# Patient Record
Sex: Male | Born: 1985 | Race: White | Hispanic: No | Marital: Single | Smoking: Former smoker
Health system: Southern US, Community
[De-identification: ages and names within clinical notes are randomized; demographics above are authoritative.]

## PROBLEM LIST (undated history)

## (undated) DIAGNOSIS — M549 Dorsalgia, unspecified: Secondary | ICD-10-CM

## (undated) DIAGNOSIS — M79671 Pain in right foot: Secondary | ICD-10-CM

## (undated) DIAGNOSIS — M25569 Pain in unspecified knee: Secondary | ICD-10-CM

## (undated) HISTORY — DX: Pain in right foot: M79.671

## (undated) HISTORY — DX: Pain in unspecified knee: M25.569

## (undated) HISTORY — DX: Morbid (severe) obesity due to excess calories: E66.01

## (undated) HISTORY — DX: Dorsalgia, unspecified: M54.9

---

## 2007-12-30 ENCOUNTER — Emergency Department (HOSPITAL_COMMUNITY): Admission: EM | Admit: 2007-12-30 | Discharge: 2007-12-30 | Payer: Self-pay | Admitting: Emergency Medicine
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
## 2018-06-08 LAB — HEPATIC FUNCTION PANEL
ALT: 19 (ref 10–40)
AST: 15 (ref 14–40)
Alkaline Phosphatase: 74 (ref 25–125)
Bilirubin, Total: 0.6

## 2018-06-08 LAB — COMPREHENSIVE METABOLIC PANEL
Albumin: 4.4 (ref 3.5–5.0)
Calcium: 9.5 (ref 8.7–10.7)
GFR calc Af Amer: 101.22
GFR calc non Af Amer: 83.65

## 2018-06-08 LAB — BASIC METABOLIC PANEL
BUN: 11 (ref 4–21)
Chloride: 106 (ref 99–108)
Creatinine: 1 (ref 0.6–1.3)
Glucose: 92
Potassium: 3.6 (ref 3.4–5.3)
Sodium: 143 (ref 137–147)

## 2018-06-08 LAB — CBC AND DIFFERENTIAL
HCT: 41 (ref 41–53)
Hemoglobin: 14 (ref 13.5–17.5)
Neutrophils Absolute: 6
Platelets: 222 (ref 150–399)
WBC: 9.3

## 2018-06-15 DIAGNOSIS — Z87898 Personal history of other specified conditions: Secondary | ICD-10-CM

## 2018-06-15 DIAGNOSIS — R002 Palpitations: Secondary | ICD-10-CM

## 2018-06-15 DIAGNOSIS — R0602 Shortness of breath: Secondary | ICD-10-CM

## 2018-06-15 DIAGNOSIS — R072 Precordial pain: Secondary | ICD-10-CM | POA: Insufficient documentation

## 2018-06-15 HISTORY — PX: ELECTROCARDIOGRAM: SHX264

## 2018-06-15 HISTORY — DX: Personal history of other specified conditions: Z87.898

## 2018-06-15 HISTORY — DX: Palpitations: R00.2

## 2018-06-15 HISTORY — DX: Shortness of breath: R06.02

## 2018-06-16 HISTORY — PX: OTHER SURGICAL HISTORY: SHX169

## 2018-06-26 HISTORY — PX: TRANSTHORACIC ECHOCARDIOGRAM: SHX275

## 2018-07-05 DIAGNOSIS — R140 Abdominal distension (gaseous): Secondary | ICD-10-CM | POA: Insufficient documentation

## 2018-07-05 DIAGNOSIS — R1010 Upper abdominal pain, unspecified: Secondary | ICD-10-CM

## 2018-07-05 DIAGNOSIS — K219 Gastro-esophageal reflux disease without esophagitis: Secondary | ICD-10-CM | POA: Insufficient documentation

## 2018-07-05 HISTORY — DX: Upper abdominal pain, unspecified: R10.10

## 2018-07-05 HISTORY — DX: Abdominal distension (gaseous): R14.0

## 2018-07-05 HISTORY — PX: ESOPHAGOGASTRODUODENOSCOPY: SHX1529

## 2019-05-16 ENCOUNTER — Telehealth: Payer: Self-pay

## 2019-05-16 NOTE — Telephone Encounter (Signed)
Received medical records from [facility].  I reviewed records and abstracted information into pts chart.   Records have been placed on Dr. McGowen's desk for review.   

## 2019-05-17 NOTE — Telephone Encounter (Signed)
Noted  

## 2019-05-24 ENCOUNTER — Ambulatory Visit: Payer: Self-pay | Admitting: Family Medicine

## 2019-05-24 ENCOUNTER — Ambulatory Visit (INDEPENDENT_AMBULATORY_CARE_PROVIDER_SITE_OTHER): Payer: 59 | Admitting: Family Medicine

## 2019-05-24 ENCOUNTER — Other Ambulatory Visit: Payer: Self-pay

## 2019-05-24 ENCOUNTER — Encounter: Payer: Self-pay | Admitting: Family Medicine

## 2019-05-24 VITALS — BP 129/84 | HR 96 | Temp 98.4°F | Resp 17 | Ht 71.0 in | Wt 284.0 lb

## 2019-05-24 DIAGNOSIS — L74519 Primary focal hyperhidrosis, unspecified: Secondary | ICD-10-CM

## 2019-05-24 DIAGNOSIS — R140 Abdominal distension (gaseous): Secondary | ICD-10-CM | POA: Diagnosis not present

## 2019-05-24 DIAGNOSIS — K929 Disease of digestive system, unspecified: Secondary | ICD-10-CM

## 2019-05-24 DIAGNOSIS — R195 Other fecal abnormalities: Secondary | ICD-10-CM

## 2019-05-24 DIAGNOSIS — R41840 Attention and concentration deficit: Secondary | ICD-10-CM | POA: Diagnosis not present

## 2019-05-24 DIAGNOSIS — G8929 Other chronic pain: Secondary | ICD-10-CM

## 2019-05-24 DIAGNOSIS — N4889 Other specified disorders of penis: Secondary | ICD-10-CM | POA: Diagnosis not present

## 2019-05-24 DIAGNOSIS — R1013 Epigastric pain: Secondary | ICD-10-CM

## 2019-05-24 LAB — COMPREHENSIVE METABOLIC PANEL
ALT: 45 U/L (ref 0–53)
AST: 26 U/L (ref 0–37)
Albumin: 4.5 g/dL (ref 3.5–5.2)
Alkaline Phosphatase: 66 U/L (ref 39–117)
BUN: 11 mg/dL (ref 6–23)
CO2: 29 mEq/L (ref 19–32)
Calcium: 9.5 mg/dL (ref 8.4–10.5)
Chloride: 103 mEq/L (ref 96–112)
Creatinine, Ser: 0.84 mg/dL (ref 0.40–1.50)
GFR: 105.21 mL/min (ref >60.00)
Glucose, Bld: 103 mg/dL — ABNORMAL HIGH (ref 70–99)
Potassium: 4.1 mEq/L (ref 3.5–5.1)
Sodium: 139 mEq/L (ref 135–145)
Total Bilirubin: 0.4 mg/dL (ref 0.2–1.2)
Total Protein: 6.8 g/dL (ref 6.0–8.3)

## 2019-05-24 LAB — CBC WITH DIFFERENTIAL/PLATELET
Basophils Absolute: 0 10*3/uL (ref 0.0–0.1)
Basophils Relative: 0.5 % (ref 0.0–3.0)
Eosinophils Absolute: 0.1 10*3/uL (ref 0.0–0.7)
Eosinophils Relative: 2.3 % (ref 0.0–5.0)
HCT: 41.4 % (ref 39.0–52.0)
Hemoglobin: 14.1 g/dL (ref 13.0–17.0)
Lymphocytes Relative: 26.9 % (ref 12.0–46.0)
Lymphs Abs: 1.3 10*3/uL (ref 0.7–4.0)
MCHC: 34.2 g/dL (ref 30.0–36.0)
MCV: 88.9 fl (ref 78.0–100.0)
Monocytes Absolute: 0.4 10*3/uL (ref 0.1–1.0)
Monocytes Relative: 8.1 % (ref 3.0–12.0)
Neutro Abs: 2.9 10*3/uL (ref 1.4–7.7)
Neutrophils Relative %: 62.2 % (ref 43.0–77.0)
Platelets: 185 10*3/uL (ref 150.0–400.0)
RBC: 4.66 Mil/uL (ref 4.22–5.81)
RDW: 13.2 % (ref 11.5–15.5)
WBC: 4.7 10*3/uL (ref 4.0–10.5)

## 2019-05-24 LAB — SEDIMENTATION RATE: Sed Rate: 5 mm/hr (ref 0–15)

## 2019-05-24 LAB — TSH: TSH: 1 u[IU]/mL (ref 0.35–4.50)

## 2019-05-24 LAB — IGA: IgA: 193 mg/dL (ref 68–378)

## 2019-05-24 LAB — C-REACTIVE PROTEIN: CRP: <1 mg/dL (ref 0.5–20.0)

## 2019-05-24 NOTE — Progress Notes (Signed)
Office Note 05/28/2019  CC:  Chief Complaint  Patient presents with  . New Patient (Initial Visit)  . Abdominal Issues   HPI:  Eric Cain is a 33 y.o.  male who is here today to establish care and discuss abdominal complaints. Patient's most recent primary MD: NONE. Old records from [facility] in [place] and from [place] were reviewed prior to or during today's visit.  Hx (chronic, or at least the last few years) of upper abd bloating and discomfort intermittently, some postprandial urgent BMs but not much.  No signif GERD sx's but sounds like some epigastric burning sometimes.  Even some lower chest discomfort, palpitations, and SOB with the abd complaints but not frequently and in fact hard to tell if even occurring WITH the upper abd c/o or SEPARATE from them.  Not assoc with particular foods/food types/or overeating but does say that eating in general does make his sx's more prominent.  No n/v.  Occ constipation or "feeling like I have to have a BM a lot but nothing comes out".   No blood in stool, no frequent diarrhea stools.  Has some mucous noted in stool "about 1 in 10 stools". He is sometimes hard to get definite answers out of-->his response to many specific questions is "sometimes" or "kind of". He also starts new complaints while we're discussing another complaint, he definitely acts very worried about his sx's and despite a reassuring GI eval 06/08/18 he is convinced something is being missed (GI note reviewed today, exam documented as normal, CMET,lipase, and CBC normal.  CT abd recommended as was EGD.  CT abd 06/16/18 showed some gastric wall thickening, f/u EGD 07/05/18 normal. CT abnormality presumed to be from incomplete distention of stomach).  GI also referred pt to cardiology b/c of pt's c/o palpitations, SOB, lower chest discomfort.  [facility] saw him 06/15/18->EKG normal.  ECHO recommended->06/26/18->entirely normal.  No fevers.  Appetite is good.   No dysphagia or odynophagia.  No frequent NSAIDs.  Occ took rolaids/tums for sx's and these helped "some" but hard to tell how much or why he did not continue to try these meds.  Says GI put him on PPI and he took it for 3-4 wks but found it to be of no help so he stopped it.  FODMAP diet advised by GI 05/2018 but hard to tell if he followed this.  Per GI note from 06/08/18 he was drinking 6-10 beers a couple nights per week and he was advised to stop drinking.  Also of note, he is very perplexed by sweating under abd pannus area as well as across his low back. Also says he has "genital warts" and asks to be tested for "the cancer from HPV". He admits he has been doing A LOT of research on google AND getting advice from a family member who is a Designer, jewellery.  Denies being sexually active, denies having intercourse with anyone with known genital wards or other STD.  NO penile d/c. He says the top of penis hurts where he feels and sees a "wart".  Exercise: most days he does a bit of cardio and lifts some wts.  States last Tdap was "2019".  Past Medical History:  Diagnosis Date  . Abdominal bloating 2019/2020   Eval led to explanation as "possible reflux and/or dyspepsia".  . History of precordial chest pain 06/15/2018  . Palpitations 06/15/2018  . SOB (shortness of breath) 06/15/2018  . Upper abdominal pain 07/05/2018  Past Surgical History:  Procedure Laterality Date  . Cat Scan Abdmoen with IV Contrast and PO Contrast  06/16/2018   Apparent gastric wall thickening towards fundus may be due to incomplete distention versus true wall thickening  . ELECTROCARDIOGRAM  06/15/2018   normal sinus rhythm, normal ECG  . ESOPHAGOGASTRODUODENOSCOPY  07/05/2018   ->normal->"this may be functional dyspepsia or reflux"  . SIGMOIDOSCOPY  approx 2013   [facility]->for feeling of constant rectal pressure; normal  . TRANSTHORACIC ECHOCARDIOGRAM  06/26/2018   NORMAL    History reviewed. No  pertinent family history.  Social History   Socioeconomic History  . Marital status: Single    Spouse name: Not on file  . Number of children: Not on file  . Years of education: Not on file  . Highest education level: Associate degree: occupational, Hotel manager, or vocational program  Occupational History  . Not on file  Tobacco Use  . Smoking status: Former Smoker    Packs/day: 1.00    Years: 7.00    Pack years: 7.00    Types: Cigarettes  . Smokeless tobacco: Former Network engineer and Sexual Activity  . Alcohol use: Yes    Comment: 6-12 drinks in a week  . Drug use: Never  . Sexual activity: Not Currently  Other Topics Concern  . Not on file  Social History Narrative   Single, no childen.   Educ: Associates deg in Hotel manager.   Occup: lost job due to covid.  Living in [place], relocated from [place].   Tob: no signif   Alc: wine 1 bottle per week.  Sometimes beer--6-12 per week.   At one point was drinking a 2-3 beers per day.   Drugs: none   Social Determinants of Health   Financial Resource Strain:   . Difficulty of Paying Living Expenses: Not on file  Food Insecurity:   . Worried About Charity fundraiser in the Last Year: Not on file  . Ran Out of Food in the Last Year: Not on file  Transportation Needs:   . Lack of Transportation (Medical): Not on file  . Lack of Transportation (Non-Medical): Not on file  Physical Activity:   . Days of Exercise per Week: Not on file  . Minutes of Exercise per Session: Not on file  Stress:   . Feeling of Stress : Not on file  Social Connections:   . Frequency of Communication with Friends and Family: Not on file  . Frequency of Social Gatherings with Friends and Family: Not on file  . Attends Religious Services: Not on file  . Active Member of Clubs or Organizations: Not on file  . Attends Archivist Meetings: Not on file  . Marital Status: Not on file  Intimate Partner Violence:   .  Fear of Current or Ex-Partner: Not on file  . Emotionally Abused: Not on file  . Physically Abused: Not on file  . Sexually Abused: Not on file   MEDS: currently NONE  Not on File  Review of Systems  Constitutional: Positive for unexpected weight change ("trouble losing wt"). Negative for fatigue and fever.  HENT: Negative for congestion and sore throat.   Eyes: Negative for visual disturbance.  Respiratory: Negative for cough.   Cardiovascular: Negative for chest pain.  Gastrointestinal: Negative for abdominal pain and nausea.  Endocrine: Positive for heat intolerance ("I sweat alot->exclusively says under pannus and accros low back). Negative for cold intolerance, polydipsia, polyphagia and polyuria.  Genitourinary: Positive for penile pain. Negative for dysuria.  Musculoskeletal: Negative for back pain and joint swelling.  Skin: Negative for pallor and rash.  Neurological: Positive for dizziness. Negative for tremors, seizures, syncope, weakness and headaches.  Hematological: Negative for adenopathy.  Psychiatric/Behavioral: Positive for decreased concentration. Negative for dysphoric mood. The patient is nervous/anxious.        Poor focus   See HPI, plus:   PE; Blood pressure 129/84, pulse 96, temperature 98.4 F (36.9 C), temperature source Temporal, resp. rate 17, height 5' 11"  (1.803 m), weight 284 lb (128.8 kg), SpO2 96 %. Body mass index is 39.61 kg/m.   Gen: Alert, well appearing.  Patient is oriented to person, place, time, and situation. AFFECT: pleasant, lucid thought and speech. IDP:OEUM: no injection, icteris, swelling, or exudate.  EOMI, PERRLA. Mouth: lips without lesion/swelling.  Oral mucosa pink and moist. Oropharynx without erythema, exudate, or swelling.  Neck - No masses or thyromegaly or limitation in range of motion CV: RRR, no m/r/g.   LUNGS: CTA bilat, nonlabored resps, good aeration in all lung fields. ABD: soft, NT, ND, BS normal.  No  hepatospenomegaly or mass.  No bruits. EXT: no clubbing or cyanosis.  no edema.  Skin - no sores or suspicious lesions or rashes or color changes Neuro: CN 2-12 intact bilaterally, strength 5/5 in proximal and distal upper extremities and lower extremities bilaterally.    No tremor.    No ataxia.  Genitals normal; both testes normal without tenderness, masses, hydroceles, varicoceles, erythema or swelling. Shaft normal, circumcised, meatus without discharge, erythema, or deformity.  When he manually opens his meatus wide, I can see a small nodular disruption in the normal mucosal lining but this does NOT appear verrucous. No inguinal hernia noted. No inguinal lymphadenopathy. I DO NOT see any focal lesions of GU area.  Specifically, no warts, vesicles, ulcerations.      Pertinent labs:  Lab Results  Component Value Date   TSH 1.00 05/24/2019   Lab Results  Component Value Date   WBC 4.7 05/24/2019   HGB 14.1 05/24/2019   HCT 41.4 05/24/2019   MCV 88.9 05/24/2019   PLT 185.0 05/24/2019   Lab Results  Component Value Date   CREATININE 0.84 05/24/2019   BUN 11 05/24/2019   NA 139 05/24/2019   K 4.1 05/24/2019   CL 103 05/24/2019   CO2 29 05/24/2019   Lab Results  Component Value Date   ALT 45 05/24/2019   AST 26 05/24/2019   ALKPHOS 66 05/24/2019   BILITOT 0.4 05/24/2019   No results found for: CHOL No results found for: HDL No results found for: LDLCALC No results found for: TRIG No results found for: CHOLHDL No results found for: PSA  No results found for: HGBA1C   ASSESSMENT AND PLAN:   New pt: no primary care MD, but GI eval and cardiology eval in [place] 2020--all records reviewed today.  1) GI complaints: dyspepsia likely, also could be IBS--primarily constipation predominant. Celiac dz and IBD in differential.  Pt with excessive amount of concern about his sx's, almost to the point of hypochondriasis. Will ask GI to see him. Will check celiac labs, CMET,  CBC, CRP, ESR.  He also requested stool eval, which I ordered today although suspect will be low yield. No meds rx'd today but will likely get him back on PPI qd trial and refer to nutritionist for help with FODMAP diet in future IF GI agrees this may be a functional  GI disorder. He may just need LOTS of reassurance.  2) Genital complaints: c/o "warts" but I see none.  He has a slight meatal nodularity that appears submucosal and he says this causes pain so I'll refer him to urology to see what they think. I repeatedly reassured him that I see no evidence of genital warts or any other kind of skin abnormality.  3) Sweating: he c/o sweating in areas where this can be expected, esp in an obese individual (underneath pannus and in low back area).  No rash is present.  Reassurance given, although he did not appear reassured by this. With this c/o + difficulty losing wt (which I suspect is more due to calories in >calories out), will check TSH.  4) Focus/Attention concerns: at the very end of today's visit, pt wanted to discuss these complaints. He asked who would be the best provider to assess him for ADD and I told him [facility] in [place]->I ordered this referral today.  An After Visit Summary was printed and given to the patient.  Spent 50 min with pt today, with >50% of this time spent in counseling and care coordination regarding the above problems.  Return in about 2 weeks (around 06/07/2019) for f/u abd complaints .  Signed:  Crissie Sickles, MD           05/28/2019

## 2019-05-28 ENCOUNTER — Encounter: Payer: Self-pay | Admitting: Family Medicine

## 2019-05-28 LAB — TISSUE TRANSGLUTAMINASE, IGA: (tTG) Ab, IgA: 1 U/mL

## 2019-06-01 LAB — GIARDIA/CRYPTOSPORIDIUM (EIA)
MICRO NUMBER:: 10004037
MICRO NUMBER:: 10004038
RESULT:: NOT DETECTED
RESULT:: NOT DETECTED
SPECIMEN QUALITY:: ADEQUATE
SPECIMEN QUALITY:: ADEQUATE

## 2019-06-01 LAB — STOOL CULTURE
MICRO NUMBER:: 10005529
MICRO NUMBER:: 10005530
MICRO NUMBER:: 10005531
SHIGA RESULT:: NOT DETECTED
SPECIMEN QUALITY:: ADEQUATE
SPECIMEN QUALITY:: ADEQUATE
SPECIMEN QUALITY:: ADEQUATE

## 2019-06-01 LAB — OVA AND PARASITE EXAMINATION
CONCENTRATE RESULT:: NONE SEEN
MICRO NUMBER:: 10004039
SPECIMEN QUALITY:: ADEQUATE
TRICHROME RESULT:: NONE SEEN

## 2019-06-01 LAB — CLOSTRIDIUM DIFFICILE TOXIN B, QUALITATIVE, REAL-TIME PCR: Toxigenic C. Difficile by PCR: NOT DETECTED

## 2019-06-11 ENCOUNTER — Other Ambulatory Visit: Payer: Self-pay

## 2019-06-11 ENCOUNTER — Ambulatory Visit (INDEPENDENT_AMBULATORY_CARE_PROVIDER_SITE_OTHER): Payer: 59 | Admitting: Family Medicine

## 2019-06-11 ENCOUNTER — Encounter: Payer: Self-pay | Admitting: Family Medicine

## 2019-06-11 VITALS — BP 138/88 | HR 91 | Temp 98.4°F | Resp 16 | Ht 71.0 in | Wt 286.6 lb

## 2019-06-11 DIAGNOSIS — R1013 Epigastric pain: Secondary | ICD-10-CM | POA: Diagnosis not present

## 2019-06-11 DIAGNOSIS — E669 Obesity, unspecified: Secondary | ICD-10-CM

## 2019-06-11 MED ORDER — PANTOPRAZOLE SODIUM 40 MG PO TBEC: 40.0000 mg | DELAYED_RELEASE_TABLET | Freq: Every day | ORAL | 3 refills | Status: DC

## 2019-06-11 NOTE — Progress Notes (Signed)
OFFICE VISIT  06/11/2019   CC:  Chief Complaint  Patient presents with  . Follow-up    abdomnial issues, pt is not fasting   HPI:    Patient is a 34 y.o. Caucasian male who presents for 3 week f/u several issues. A/P as of last visit (his "establish care" visit): " 1) GI complaints: dyspepsia likely, also could be IBS--primarily constipation predominant. Celiac dz and IBD in differential.  Pt with excessive amount of concern about his sx's, almost to the point of hypochondriasis. Will ask GI to see him. Will check celiac labs, CMET, CBC, CRP, ESR.  He also requested stool eval, which I ordered today although suspect will be low yield. No meds rx'd today but will likely get him back on PPI qd trial and refer to nutritionist for help with FODMAP diet in future IF GI agrees this may be a functional GI disorder. He may just need LOTS of reassurance.  2) Genital complaints: c/o "warts" but I see none.  He has a slight meatal nodularity that appears submucosal and he says this causes pain so I'll refer him to urology to see what they think. I repeatedly reassured him that I see no evidence of genital warts or any other kind of skin abnormality.  3) Sweating: he c/o sweating in areas where this can be expected, esp in an obese individual (underneath pannus and in low back area).  No rash is present.  Reassurance given, although he did not appear reassured by this. With this c/o + difficulty losing wt (which I suspect is more due to calories in >calories out), will check TSH.  4) Focus/Attention concerns: at the very end of today's visit, pt wanted to discuss these complaints. He asked who would be the best provider to assess him for ADD and I told him [facility] in [place]->I ordered this referral today."  Interim hx:  All blood and stool tests done last visit were NORMAL. I inadvertently failed to refer him to GI after last visit. Denies any change: still mainly bloated  feeling not tied to any specific types of food. Has been doing more juicing in diet. Treadmill 15 min, wts 45 min.  He's frustrated with years of wt gain despite feeling like he eats reasonably and exercises regularly. Got appt arranged with urology.  Past Medical History:  Diagnosis Date  . Abdominal bloating 2019/2020   Eval led to explanation as "possible reflux and/or dyspepsia".  . History of precordial chest pain 06/15/2018  . Palpitations 06/15/2018  . SOB (shortness of breath) 06/15/2018  . Upper abdominal pain 07/05/2018    Past Surgical History:  Procedure Laterality Date  . Cat Scan Abdmoen with IV Contrast and PO Contrast  06/16/2018   Apparent gastric wall thickening towards fundus may be due to incomplete distention versus true wall thickening  . ELECTROCARDIOGRAM  06/15/2018   normal sinus rhythm, normal ECG  . ESOPHAGOGASTRODUODENOSCOPY  07/05/2018   ->normal->"this may be functional dyspepsia or reflux"  . SIGMOIDOSCOPY  approx 2013   [facility]->for feeling of constant rectal pressure; normal  . TRANSTHORACIC ECHOCARDIOGRAM  06/26/2018   NORMAL     No Known Allergies  ROS As per HPI  PE: Blood pressure 138/88, pulse 91, temperature 98.4 F (36.9 C), temperature source Temporal, resp. rate 16, height 5' 11" (1.803 m), weight 286 lb 9.6 oz (130 kg), SpO2 97 %. Body mass index is 39.97 kg/m.  Gen: Alert, well appearing.  Patient is oriented to  person, place, time, and situation. AFFECT: pleasant, lucid thought and speech. No exam today.  LABS:  Lab Results  Component Value Date   TSH 1.00 05/24/2019   Lab Results  Component Value Date   WBC 4.7 05/24/2019   HGB 14.1 05/24/2019   HCT 41.4 05/24/2019   MCV 88.9 05/24/2019   PLT 185.0 05/24/2019   Lab Results  Component Value Date   CREATININE 0.84 05/24/2019   BUN 11 05/24/2019   NA 139 05/24/2019   K 4.1 05/24/2019   CL 103 05/24/2019   CO2 29 05/24/2019   Lab Results  Component  Value Date   ALT 45 05/24/2019   AST 26 05/24/2019   ALKPHOS 66 05/24/2019   BILITOT 0.4 05/24/2019   Lab Results  Component Value Date   ESRSEDRATE 5 05/24/2019   Lab Results  Component Value Date   CRP <1.0 05/24/2019   No results found for: HGBA1C  IMPRESSION AND PLAN:  1) GI complaints--many->most c/w dyspepsia. Start pantoprazole 69m qd.  2) Obesity: suspect insulin resistance.  However, diet/caloric intake still likely >calories burned. Pt agreeable to [facility]-->Dr. BShary Decamp  3) Penis c/o->small nodular spot at tip of meatus->he has urologic appt for further eval.  4) Poos concentration/focus: [place]  An After Visit Summary was printed and given to the patient.  FOLLOW UP: Return in about 4 weeks (around 07/09/2019) for f/u abd probs/dyspepsia.  Signed:  PCrissie Sickles MD           06/11/2019

## 2019-07-09 ENCOUNTER — Encounter: Payer: Self-pay | Admitting: Family Medicine

## 2019-07-09 ENCOUNTER — Ambulatory Visit (INDEPENDENT_AMBULATORY_CARE_PROVIDER_SITE_OTHER): Payer: 59 | Admitting: Family Medicine

## 2019-07-09 ENCOUNTER — Other Ambulatory Visit: Payer: Self-pay

## 2019-07-09 DIAGNOSIS — K219 Gastro-esophageal reflux disease without esophagitis: Secondary | ICD-10-CM | POA: Diagnosis not present

## 2019-07-09 DIAGNOSIS — R6882 Decreased libido: Secondary | ICD-10-CM | POA: Diagnosis not present

## 2019-07-09 LAB — HEMOGLOBIN A1C: Hgb A1c MFr Bld: 5.1 % (ref 4.6–6.5)

## 2019-07-09 MED ORDER — PANTOPRAZOLE SODIUM 40 MG PO TBEC: DELAYED_RELEASE_TABLET | ORAL | 3 refills | Status: AC

## 2019-07-09 NOTE — Progress Notes (Signed)
OFFICE VISIT  07/09/2019   CC:  Chief Complaint  Patient presents with  . Follow-up    abdominal pain   HPI:    Patient is a 34 y.o. Caucasian male who presents for 1 mo f/u GI complaints. A/P as of last visit: "1) GI complaints--many->most c/w dyspepsia. Start pantoprazole 40mg  qd.  2) Obesity: suspect insulin resistance.  However, diet/caloric intake still likely >calories burned. Pt agreeable to referral [facility]-->Dr. Shary Decamp.  3) Penis c/o->small nodular spot at tip of meatus->he has urologic appt for further eval.  4) Poos concentration/focus: I've referred him to [employer] but no appt arranged yet."  Interim hx: No improvement on med.  Having notable GER sx's about 5 min after eating. A few bad episodes of GER last week, regurg up to mouth/nose.  Has eliminated beer and coffee. Starting keto diet today.  3-4 d/week workouts some cardio/some wt's.  [facility]ariat[facility]/wt loss clinic contacted him but he has not gotten back with them. Still not working but is closing on his home sale in [place] soon, then will start looking seriously.  Libido: impaired somewhat x years.  Reports hx of borderline low testost about 10 yrs ago.    Past Medical History:  Diagnosis Date  . Abdominal bloating 2019/2020   Eval led to explanation as "possible reflux and/or dyspepsia".  . History of precordial chest pain 06/15/2018  . Morbid obesity ([place])   . Palpitations 06/15/2018  . SOB (shortness of breath) 06/15/2018  . Upper abdominal pain 07/05/2018    Past Surgical History:  Procedure Laterality Date  . CT abd/pelv w/cont  06/16/2018   Apparent gastric wall thickening towards fundus may be due to incomplete distention versus true wall thickening  . ELECTROCARDIOGRAM  06/15/2018   normal sinus rhythm, normal ECG  . ESOPHAGOGASTRODUODENOSCOPY  07/05/2018   ->normal->"this may be functional dyspepsia or reflux"  . SIGMOIDOSCOPY  approx  2013   [facility]->for feeling of constant rectal pressure; normal  . TRANSTHORACIC ECHOCARDIOGRAM  06/26/2018   NORMAL    Outpatient Medications Prior to Visit  Medication Sig Dispense Refill  . pantoprazole (PROTONIX) 40 MG tablet Take 1 tablet (40 mg total) by mouth daily. 30 tablet 3   No facility-administered medications prior to visit.    No Known Allergies  ROS As per HPI  PE: Blood pressure 132/84, pulse 84, temperature 98 F (36.7 C), temperature source Temporal, resp. rate 16, height 5\' 11"  (1.803 m), weight 290 lb 6.4 oz (131.7 kg), SpO2 98 %. Body mass index is 40.5 kg/m.  Gen: Alert, well appearing.  Patient is oriented to person, place, time, and situation. AFFECT: pleasant, lucid thought and speech. NO further exam today.  LABS:  Lab Results  Component Value Date   TSH 1.00 05/24/2019   Lab Results  Component Value Date   WBC 4.7 05/24/2019   HGB 14.1 05/24/2019   HCT 41.4 05/24/2019   MCV 88.9 05/24/2019   PLT 185.0 05/24/2019   Lab Results  Component Value Date   CREATININE 0.84 05/24/2019   BUN 11 05/24/2019   NA 139 05/24/2019   K 4.1 05/24/2019   CL 103 05/24/2019   CO2 29 05/24/2019   Lab Results  Component Value Date   ALT 45 05/24/2019   AST 26 05/24/2019   ALKPHOS 66 05/24/2019   BILITOT 0.4 05/24/2019   Lab Results  Component Value Date   ESRSEDRATE 5 05/24/2019    IMPRESSION AND PLAN:  1) GERD, dyspepsia: no change on pantoprazole 40mg  qd x 1 mo. Go to bid dosing of pantoprazole.   Elevate HOB. He is making some dietary changes.  He's hoping keto diet helps,starting this today.  2) Morbid obesity. He has pretty good dietary habits, good exercise habits. Not much success losing wt.  He is contemplating going to wt loss clinic. Check HbA1c today.  3) Decreased libido: check testost level today (10AM).  An After Visit Summary was printed and given to the patient.  FOLLOW UP: Return in about 4 weeks (around  08/06/2019) for f/u GER/dyspepsia.  Signed:  08/08/2019, MD           07/09/2019

## 2019-07-09 NOTE — Patient Instructions (Signed)
Gastroesophageal Reflux Disease, Adult Gastroesophageal reflux (GER) happens when acid from the stomach flows up into the tube that connects the mouth and the stomach (esophagus). Normally, food travels down the esophagus and stays in the stomach to be digested. However, when a person has GER, food and stomach acid sometimes move back up into the esophagus. If this becomes a more serious problem, the person may be diagnosed with a disease called gastroesophageal reflux disease (GERD). GERD occurs when the reflux:  Happens often.  Causes frequent or severe symptoms.  Causes problems such as damage to the esophagus. When stomach acid comes in contact with the esophagus, the acid may cause soreness (inflammation) in the esophagus. Over time, GERD may create small holes (ulcers) in the lining of the esophagus. What are the causes? This condition is caused by a problem with the muscle between the esophagus and the stomach (lower esophageal sphincter, or LES). Normally, the LES muscle closes after food passes through the esophagus to the stomach. When the LES is weakened or abnormal, it does not close properly, and that allows food and stomach acid to go back up into the esophagus. The LES can be weakened by certain dietary substances, medicines, and medical conditions, including:  Tobacco use.  Pregnancy.  Having a hiatal hernia.  Alcohol use.  Certain foods and beverages, such as coffee, chocolate, onions, and peppermint. What increases the risk? You are more likely to develop this condition if you:  Have an increased body weight.  Have a connective tissue disorder.  Use NSAID medicines. What are the signs or symptoms? Symptoms of this condition include:  Heartburn.  Difficult or painful swallowing.  The feeling of having a lump in the throat.  Abitter taste in the mouth.  Bad breath.  Having a large amount of saliva.  Having an upset or bloated  stomach.  Belching.  Chest pain. Different conditions can cause chest pain. Make sure you see your health care provider if you experience chest pain.  Shortness of breath or wheezing.  Ongoing (chronic) cough or a night-time cough.  Wearing away of tooth enamel.  Weight loss. How is this diagnosed? Your health care provider will take a medical history and perform a physical exam. To determine if you have mild or severe GERD, your health care provider may also monitor how you respond to treatment. You may also have tests, including:  A test to examine your stomach and esophagus with a small camera (endoscopy).  A test thatmeasures the acidity level in your esophagus.  A test thatmeasures how much pressure is on your esophagus.  A barium swallow or modified barium swallow test to show the shape, size, and functioning of your esophagus. How is this treated? The goal of treatment is to help relieve your symptoms and to prevent complications. Treatment for this condition may vary depending on how severe your symptoms are. Your health care provider may recommend:  Changes to your diet.  Medicine.  Surgery. Follow these instructions at home: Eating and drinking   Follow a diet as recommended by your health care provider. This may involve avoiding foods and drinks such as: ? Coffee and tea (with or without caffeine). ? Drinks that containalcohol. ? Energy drinks and sports drinks. ? Carbonated drinks or sodas. ? Chocolate and cocoa. ? Peppermint and mint flavorings. ? Garlic and onions. ? Horseradish. ? Spicy and acidic foods, including peppers, chili powder, curry powder, vinegar, hot sauces, and barbecue sauce. ? Citrus fruit juices and citrus   fruits, such as oranges, lemons, and limes. ? Tomato-based foods, such as red sauce, chili, salsa, and pizza with red sauce. ? Fried and fatty foods, such as donuts, french fries, potato chips, and high-fat dressings. ? High-fat  meats, such as hot dogs and fatty cuts of red and white meats, such as rib eye steak, sausage, ham, and bacon. ? High-fat dairy items, such as whole milk, butter, and cream cheese.  Eat small, frequent meals instead of large meals.  Avoid drinking large amounts of liquid with your meals.  Avoid eating meals during the 2-3 hours before bedtime.  Avoid lying down right after you eat.  Do not exercise right after you eat. Lifestyle   Do not use any products that contain nicotine or tobacco, such as cigarettes, e-cigarettes, and chewing tobacco. If you need help quitting, ask your health care provider.  Try to reduce your stress by using methods such as yoga or meditation. If you need help reducing stress, ask your health care provider.  If you are overweight, reduce your weight to an amount that is healthy for you. Ask your health care provider for guidance about a safe weight loss goal. General instructions  Pay attention to any changes in your symptoms.  Take over-the-counter and prescription medicines only as told by your health care provider. Do not take aspirin, ibuprofen, or other NSAIDs unless your health care provider told you to do so.  Wear loose-fitting clothing. Do not wear anything tight around your waist that causes pressure on your abdomen.  Raise (elevate) the head of your bed about 6 inches (15 cm).  Avoid bending over if this makes your symptoms worse.  Keep all follow-up visits as told by your health care provider. This is important. Contact a health care provider if:  You have: ? New symptoms. ? Unexplained weight loss. ? Difficulty swallowing or it hurts to swallow. ? Wheezing or a persistent cough. ? A hoarse voice.  Your symptoms do not improve with treatment. Get help right away if you:  Have pain in your arms, neck, jaw, teeth, or back.  Feel sweaty, dizzy, or light-headed.  Have chest pain or shortness of breath.  Vomit and your vomit looks  like blood or coffee grounds.  Faint.  Have stool that is bloody or black.  Cannot swallow, drink, or eat. Summary  Gastroesophageal reflux happens when acid from the stomach flows up into the esophagus. GERD is a disease in which the reflux happens often, causes frequent or severe symptoms, or causes problems such as damage to the esophagus.  Treatment for this condition may vary depending on how severe your symptoms are. Your health care provider may recommend diet and lifestyle changes, medicine, or surgery.  Contact a health care provider if you have new or worsening symptoms.  Take over-the-counter and prescription medicines only as told by your health care provider. Do not take aspirin, ibuprofen, or other NSAIDs unless your health care provider told you to do so.  Keep all follow-up visits as told by your health care provider. This is important. This information is not intended to replace advice given to you by your health care provider. Make sure you discuss any questions you have with your health care provider. Document Revised: 11/16/2017 Document Reviewed: 11/16/2017 Elsevier Patient Education  2020 [employer]  

## 2019-07-10 LAB — TESTOSTERONE TOTAL,FREE,BIO, MALES
Albumin: 4.4 g/dL (ref 3.6–5.1)
Sex Hormone Binding: 22 nmol/L (ref 10–50)
Testosterone, Bioavailable: 166.8 ng/dL (ref >=110.0)
Testosterone, Free: 82.9 pg/mL (ref 46.0–224.0)
Testosterone: 450 ng/dL (ref 250–827)

## 2019-08-20 ENCOUNTER — Encounter (INDEPENDENT_AMBULATORY_CARE_PROVIDER_SITE_OTHER): Payer: Self-pay | Admitting: Family Medicine

## 2019-08-20 ENCOUNTER — Other Ambulatory Visit: Payer: Self-pay

## 2019-08-20 ENCOUNTER — Ambulatory Visit (INDEPENDENT_AMBULATORY_CARE_PROVIDER_SITE_OTHER): Payer: 59 | Admitting: Family Medicine

## 2019-08-20 VITALS — BP 136/79 | HR 89 | Temp 98.4°F | Ht 70.0 in | Wt 282.0 lb

## 2019-08-20 DIAGNOSIS — Z9189 Other specified personal risk factors, not elsewhere classified: Secondary | ICD-10-CM | POA: Diagnosis not present

## 2019-08-20 DIAGNOSIS — R0602 Shortness of breath: Secondary | ICD-10-CM

## 2019-08-20 DIAGNOSIS — Z6841 Body Mass Index (BMI) 40.0 and over, adult: Secondary | ICD-10-CM

## 2019-08-20 DIAGNOSIS — R739 Hyperglycemia, unspecified: Secondary | ICD-10-CM

## 2019-08-20 DIAGNOSIS — R5383 Other fatigue: Secondary | ICD-10-CM | POA: Diagnosis not present

## 2019-08-20 DIAGNOSIS — Z0289 Encounter for other administrative examinations: Secondary | ICD-10-CM

## 2019-08-20 DIAGNOSIS — Z1331 Encounter for screening for depression: Secondary | ICD-10-CM

## 2019-08-20 NOTE — Progress Notes (Signed)
Dear Dr. Nicoletta Ba,   Thank you for referring Eric Cain to our clinic. The following note includes my evaluation and treatment recommendations.  Chief Complaint:   OBESITY Eric Cain (MR# [id number]) is a 34 y.o. male who presents for evaluation and treatment of obesity and related comorbidities. Current BMI is Body mass index is 40.46 kg/m.Eric Cain has been struggling with his weight for many years and has been unsuccessful in either losing weight, maintaining weight loss, or reaching his healthy weight goal.  Eric Cain is currently in the action stage of change and ready to dedicate time achieving and maintaining a healthier weight. Eric Cain is interested in becoming our patient and working on intensive lifestyle modifications including (but not limited to) diet and exercise for weight loss.  Eric Cain is referred by Dr. Milinda Cave. Pre-workout in the a.m. Garrell has a low fat, low carb protein shake and 3-4 eggs (feels full); 4-6 p.m. will have 2 chicken drumsticks and vegetables or 1/4 to 1/3 lb meat hamburger, 1 cup vegetables and baked potato. He exercises 4 times per week with cardio and weightlifting.  Eric Cain's habits were reviewed today and are as follows: His family eats meals together, his desired weight loss is 47 lbs, he has been heavy most of his life, he started gaining weight in 2015, his heaviest weight ever was 295 pounds, he skips breakfast sometimes, he is frequently drinking liquids with calories, he sometimes has problems with excessive hunger, he sometimes eats larger portions than normal, he has binge eating behaviors and he struggles with emotional eating.  Depression Screen Eric Cain's Food and Mood (modified PHQ-9) Cain was 12.  Depression screen PHQ 2/9 08/20/2019  Decreased Interest 2  Down, Depressed, Hopeless 2  PHQ - 2 Cain 4  Altered sleeping 1  Tired, decreased energy 3  Change in appetite 2  Feeling bad or failure about yourself  1  Trouble concentrating 0    Moving slowly or fidgety/restless 1  Suicidal thoughts 0  PHQ-9 Cain 12  Difficult doing work/chores Somewhat difficult   Subjective:   Other fatigue. Eric Cain denies daytime somnolence and admits to waking up still tired. Eric Cain generally gets 8 hours of sleep per night, and states that he has generally restful sleep 50% of the time. Eric Cain is present. Apneic episodes are not present. Eric Cain is 5.  Shortness of breath on exertion. [name] notes increasing shortness of breath with exercising and seems to be worsening over time with weight gain. He notes getting out of breath sooner with activity than he used to. This has gotten worse recently. Eric Cain denies shortness of breath at rest or orthopnea.  Hyperglycemia. Eric Cain has a history of some elevated blood glucose readings without a diagnosis of diabetes. Glucose 103. Last HgA1c 5.1.  Depression screening. Eric Cain had a moderately positive depression screen with a PHQ-9 Cain of 12.  At risk for diabetes mellitus. Eric Cain is at higher than average risk for developing diabetes due to his obesity.   Assessment/Plan:   Other fatigue. [name] does feel that his weight is causing his energy to be lower than it should be. Fatigue may be related to obesity, depression or many other causes. Labs will be ordered, and in the meanwhile, Eric Cain will focus on self care including making healthy food choices, increasing physical activity and focusing on stress reduction. EKG 12-Lead showed NSR @ 90 BPM. CBC with Differential/Platelet, T3, T4, free, TSH, VITAMIN D 25 Hydroxy (Vit-D Deficiency, Fractures), Folate, Vitamin B12 labs ordered  today.  Shortness of breath on exertion. [name] does feel that he gets out of breath more easily that he used to when he exercises. [name]'s shortness of breath appears to be obesity related and exercise induced. He has agreed to work on weight loss and gradually increase exercise to treat his exercise induced shortness  of breath. Will continue to monitor closely. Lipid Panel With LDL/HDL Ratio ordered.  Hyperglycemia. Fasting labs will be obtained and results with be discussed with Eric Cain in 2 weeks at his follow up visit. In the meanwhile Eric Cain was started on a lower simple carbohydrate diet and will work on weight loss efforts. Comprehensive metabolic panel, Insulin, random ordered today.  Depression screening. Eric Cain had a positive depression screening. Depression is commonly associated with obesity and often results in emotional eating behaviors. We will monitor this closely and work on CBT to help improve the non-hunger eating patterns. Referral to Psychology may be required if no improvement is seen as he continues in our clinic.  At risk for diabetes mellitus. Eric Cain was given approximately 15 minutes of diabetes education and counseling today. We discussed intensive lifestyle modifications today with an emphasis on weight loss as well as increasing exercise and decreasing simple carbohydrates in his diet. We also reviewed medication options with an emphasis on risk versus benefit of those discussed.   Repetitive spaced learning was employed today to elicit superior memory formation and behavioral change.  Class 3 severe obesity with serious comorbidity and body mass index (BMI) of 40.0 to 44.9 in adult, unspecified obesity type ([place]).  Eric Cain is currently in the action stage of change and his goal is to continue with weight loss efforts. I recommend Eric Cain begin the structured treatment plan as follows:  He has agreed to the Category 3 Plan.  Exercise goals: Eric Cain will continue his current exercise regimen.   Behavioral modification strategies: increasing lean protein intake, increasing vegetables, meal planning and cooking strategies, keeping healthy foods in the home and planning for success.  He was informed of the importance of frequent follow-up visits to maximize his success with intensive lifestyle  modifications for his multiple health conditions. He was informed we would discuss his lab results at his next visit unless there is a critical issue that needs to be addressed sooner. Duvan agreed to keep his next visit at the agreed upon time to discuss these results.  Objective:   Blood pressure 136/79, pulse 89, temperature 98.4 F (36.9 C), temperature source Oral, height 5\' 10"  (1.778 m), weight 282 lb (127.9 kg), SpO2 97 %. Body mass index is 40.46 kg/m.  EKG: Normal sinus rhythm, rate 90. Within normal limits.  Indirect Calorimeter completed today shows a VO2 of 246 and a REE of 1711.  His calculated basal metabolic rate is 5009 thus his basal metabolic rate is worse than expected.  General: Cooperative, alert, well developed, in no acute distress. HEENT: Conjunctivae and lids unremarkable. Cardiovascular: Regular rhythm.  Lungs: Normal work of breathing. Neurologic: No focal deficits.   Lab Results  Component Value Date   CREATININE 0.84 05/24/2019   BUN 11 05/24/2019   NA 139 05/24/2019   K 4.1 05/24/2019   CL 103 05/24/2019   CO2 29 05/24/2019   Lab Results  Component Value Date   ALT 45 05/24/2019   AST 26 05/24/2019   ALKPHOS 66 05/24/2019   BILITOT 0.4 05/24/2019   Lab Results  Component Value Date   HGBA1C 5.1 07/09/2019   No results found for:  INSULIN Lab Results  Component Value Date   TSH 1.00 05/24/2019   No results found for: CHOL, HDL, LDLCALC, LDLDIRECT, TRIG, CHOLHDL Lab Results  Component Value Date   WBC 4.7 05/24/2019   HGB 14.1 05/24/2019   HCT 41.4 05/24/2019   MCV 88.9 05/24/2019   PLT 185.0 05/24/2019   No results found for: IRON, TIBC, FERRITIN  Attestation Statements:   This is the patient's first visit at [facility]. The patient's NEW PATIENT PACKET was reviewed at length. Included in the packet: current and past health history, medications, allergies, ROS, gynecologic history (women only), surgical history,  family history, social history, weight history, weight loss surgery history (for those that have had weight loss surgery), nutritional evaluation, mood and food questionnaire, PHQ9, Eric questionnaire, sleep habits questionnaire, patient life and health improvement goals questionnaire. These will all be scanned into the patient's chart under media.   During the visit, I independently reviewed the patient's EKG, bioimpedance scale results, and indirect calorimeter results. I used this information to tailor a meal plan for the patient that will help him to lose weight and will improve his obesity-related conditions going forward. I performed a medically necessary appropriate examination and/or evaluation. I discussed the assessment and treatment plan with the patient. The patient was provided an opportunity to ask questions and all were answered. The patient agreed with the plan and demonstrated an understanding of the instructions. Labs were ordered at this visit and will be reviewed at the next visit unless more critical results need to be addressed immediately. Clinical information was updated and documented in the EMR.   Time spent on visit including pre-visit chart review and post-visit care was 45 minutes.   A separate 15 minutes was spent on risk counseling (see above).   I, Marianna Payment, am acting as transcriptionist for Reuben Likes, MD   I have reviewed the above documentation for accuracy and completeness, and I agree with the above. - Debbra Riding, MD

## 2019-08-21 LAB — LIPID PANEL WITH LDL/HDL RATIO
Cholesterol, Total: 235 mg/dL — ABNORMAL HIGH (ref 100–199)
HDL: 48 mg/dL (ref >39)
LDL Chol Calc (NIH): 149 mg/dL — ABNORMAL HIGH (ref 0–99)
LDL/HDL Ratio: 3.1 ratio (ref 0.0–3.6)
Triglycerides: 207 mg/dL — ABNORMAL HIGH (ref 0–149)
VLDL Cholesterol Cal: 38 mg/dL (ref 5–40)

## 2019-08-21 LAB — COMPREHENSIVE METABOLIC PANEL
ALT: 24 IU/L (ref 0–44)
AST: 17 IU/L (ref 0–40)
Albumin/Globulin Ratio: 1.8 (ref 1.2–2.2)
Albumin: 4.5 g/dL (ref 4.0–5.0)
Alkaline Phosphatase: 79 IU/L (ref 39–117)
BUN/Creatinine Ratio: 10 (ref 9–20)
BUN: 9 mg/dL (ref 6–20)
Bilirubin Total: 0.3 mg/dL (ref 0.0–1.2)
CO2: 24 mmol/L (ref 20–29)
Calcium: 9.5 mg/dL (ref 8.7–10.2)
Chloride: 104 mmol/L (ref 96–106)
Creatinine, Ser: 0.86 mg/dL (ref 0.76–1.27)
GFR calc Af Amer: 132 mL/min/{1.73_m2} (ref >59)
GFR calc non Af Amer: 114 mL/min/{1.73_m2} (ref >59)
Globulin, Total: 2.5 g/dL (ref 1.5–4.5)
Glucose: 85 mg/dL (ref 65–99)
Potassium: 4.1 mmol/L (ref 3.5–5.2)
Sodium: 141 mmol/L (ref 134–144)
Total Protein: 7 g/dL (ref 6.0–8.5)

## 2019-08-21 LAB — VITAMIN D 25 HYDROXY (VIT D DEFICIENCY, FRACTURES): Vit D, 25-Hydroxy: 31.6 ng/mL (ref 30.0–100.0)

## 2019-08-21 LAB — CBC WITH DIFFERENTIAL/PLATELET
Basophils Absolute: 0 10*3/uL (ref 0.0–0.2)
Basos: 1 %
EOS (ABSOLUTE): 0.1 10*3/uL (ref 0.0–0.4)
Eos: 2 %
Hematocrit: 46.3 % (ref 37.5–51.0)
Hemoglobin: 15.7 g/dL (ref 13.0–17.7)
Immature Grans (Abs): 0 10*3/uL (ref 0.0–0.1)
Immature Granulocytes: 0 %
Lymphocytes Absolute: 1.4 10*3/uL (ref 0.7–3.1)
Lymphs: 23 %
MCH: 30.5 pg (ref 26.6–33.0)
MCHC: 33.9 g/dL (ref 31.5–35.7)
MCV: 90 fL (ref 79–97)
Monocytes Absolute: 0.5 10*3/uL (ref 0.1–0.9)
Monocytes: 8 %
Neutrophils Absolute: 4.1 10*3/uL (ref 1.4–7.0)
Neutrophils: 66 %
Platelets: 191 10*3/uL (ref 150–450)
RBC: 5.15 x10E6/uL (ref 4.14–5.80)
RDW: 12.6 % (ref 11.6–15.4)
WBC: 6.2 10*3/uL (ref 3.4–10.8)

## 2019-08-21 LAB — VITAMIN B12: Vitamin B-12: 631 pg/mL (ref 232–1245)

## 2019-08-21 LAB — FOLATE: Folate: 17 ng/mL (ref >3.0)

## 2019-08-21 LAB — INSULIN, RANDOM: INSULIN: 8.9 u[IU]/mL (ref 2.6–24.9)

## 2019-08-21 LAB — TSH: TSH: 1.35 u[IU]/mL (ref 0.450–4.500)

## 2019-08-21 LAB — T4, FREE: Free T4: 1.33 ng/dL (ref 0.82–1.77)

## 2019-08-21 LAB — T3: T3, Total: 100 ng/dL (ref 71–180)

## 2019-09-03 ENCOUNTER — Encounter (INDEPENDENT_AMBULATORY_CARE_PROVIDER_SITE_OTHER): Payer: Self-pay | Admitting: Family Medicine

## 2019-09-03 ENCOUNTER — Ambulatory Visit (INDEPENDENT_AMBULATORY_CARE_PROVIDER_SITE_OTHER): Payer: 59 | Admitting: Family Medicine

## 2019-09-03 ENCOUNTER — Other Ambulatory Visit: Payer: Self-pay

## 2019-09-03 VITALS — BP 131/90 | HR 95 | Temp 98.1°F | Ht 70.0 in | Wt 285.0 lb

## 2019-09-03 DIAGNOSIS — Z9189 Other specified personal risk factors, not elsewhere classified: Secondary | ICD-10-CM | POA: Diagnosis not present

## 2019-09-03 DIAGNOSIS — E7849 Other hyperlipidemia: Secondary | ICD-10-CM | POA: Diagnosis not present

## 2019-09-03 DIAGNOSIS — Z6841 Body Mass Index (BMI) 40.0 and over, adult: Secondary | ICD-10-CM

## 2019-09-03 DIAGNOSIS — E8881 Metabolic syndrome: Secondary | ICD-10-CM | POA: Diagnosis not present

## 2019-09-03 DIAGNOSIS — E559 Vitamin D deficiency, unspecified: Secondary | ICD-10-CM | POA: Diagnosis not present

## 2019-09-04 MED ORDER — VITAMIN D (ERGOCALCIFEROL) 1.25 MG (50000 UNIT) PO CAPS: 50000.0000 [IU] | ORAL_CAPSULE | ORAL | 0 refills | Status: AC

## 2019-09-04 NOTE — Progress Notes (Signed)
Chief Complaint:   OBESITY Eric Cain is here to discuss his progress with his obesity treatment plan along with follow-up of his obesity related diagnoses. Eric Cain is on the Category 3 Plan and states he is following his eating plan approximately 50% of the time. Eric Cain states he is at the gym for 60 minutes 4 times per week.  Today's visit was #: 2 Starting weight: 282 lbs Starting date: 08/20/2019 Today's weight: 285 lbs Today's date: 09/03/2019 Total lbs lost to date: 0 Total lbs lost since last in-office visit: 0  Interim History: Eric Cain went to [employer] for a week and returned 1 week ago. Since returning he has been helping his family. He didn't follow his plan per say when he was away. He notes breakfast has been easy for him to adhere to as is lunch. Dinner is hard to adhere to secondary to going out to eat. He is doing a sandwich option for lunch. He does mention he seems to be getting hungry with starting the meal plan.  Subjective:   1. Other hyperlipidemia Eric Cain's last LDL was 149, HDL 48, and triglycerides 742. He is not on statin. His age excludes ASCVD risk stratification. I discussed labs with the patient today.  2. Vitamin D deficiency Eric Cain is not on Vit D replacement. He notes fatigue.I discussed labs with the patient today.  3. Insulin resistance Eric Cain's last Hgb A1c was 5.1 and insulin 8.9. He notes some hunger but not uncontrollable. I discussed labs with the patient today.  4. At risk for osteoporosis Jospeh is at higher risk of osteopenia and osteoporosis due to Vitamin D deficiency.   Assessment/Plan:   1. Other hyperlipidemia Cardiovascular risk and specific lipid/LDL goals reviewed. We discussed several lifestyle modifications today and Strider will continue to work on diet, exercise and weight loss efforts. We will repeat labs in 3 months. Orders and follow up as documented in patient record.   Counseling Intensive lifestyle modifications are the first line  treatment for this issue. . Dietary changes: Increase soluble fiber. Decrease simple carbohydrates. . Exercise changes: Moderate to vigorous-intensity aerobic activity 150 minutes per week if tolerated. . Lipid-lowering medications: see documented in medical record.  2. Vitamin D deficiency Low Vitamin D level contributes to fatigue and are associated with obesity, breast, and colon cancer. Eric Cain agreed to start prescription Vitamin D 50,000 IU every week with no refills. He will follow-up for routine testing of Vitamin D, at least 2-3 times per year to avoid over-replacement.  - Vitamin D, Ergocalciferol, (DRISDOL) 1.25 MG (50000 UNIT) CAPS capsule; Take 1 capsule (50,000 Units total) by mouth every 7 (seven) days.  Dispense: 4 capsule; Refill: 0  3. Insulin resistance Eric Cain will continue to work on weight loss, exercise, and decreasing simple carbohydrates to help decrease the risk of diabetes. We will repeat labs in 3 months. Eric Cain agreed to follow-up with [place] as directed to closely monitor his progress.  4. At risk for osteoporosis Eric Cain was given approximately 30 minutes of osteoporosis prevention counseling today. Eric Cain is at risk for osteopenia and osteoporosis due to his Vitamin D deficiency. He was encouraged to take his Vitamin D and follow his higher calcium diet and increase strengthening exercise to help strengthen his bones and decrease his risk of osteopenia and osteoporosis.  Repetitive spaced learning was employed today to elicit superior memory formation and behavioral change.  5. Class 3 severe obesity with serious comorbidity and body mass index (BMI) of 40.0 to 44.9 in  adult, unspecified obesity type ([place]) Amelio is currently in the action stage of change. As such, his goal is to continue with weight loss efforts. He has agreed to the Category 3 Plan or keeping a food journal and adhering to recommended goals of 1450-1600 calories and 100+ grams of protein daily.   Exercise  goals: As is.  Behavioral modification strategies: increasing lean protein intake, increasing vegetables, meal planning and cooking strategies, keeping healthy foods in the home, better snacking choices and planning for success.  Eric Cain has agreed to follow-up with our clinic in 2 weeks. He was informed of the importance of frequent follow-up visits to maximize his success with intensive lifestyle modifications for his multiple health conditions.   Objective:   Blood pressure 131/90, pulse 95, temperature 98.1 F (36.7 C), temperature source Oral, height 5\' 10"  (1.778 m), weight 285 lb (129.3 kg), SpO2 97 %. Body mass index is 40.89 kg/m.  General: Cooperative, alert, well developed, in no acute distress. HEENT: Conjunctivae and lids unremarkable. Cardiovascular: Regular rhythm.  Lungs: Normal work of breathing. Neurologic: No focal deficits.   Lab Results  Component Value Date   CREATININE 0.86 08/20/2019   BUN 9 08/20/2019   NA 141 08/20/2019   K 4.1 08/20/2019   CL 104 08/20/2019   CO2 24 08/20/2019   Lab Results  Component Value Date   ALT 24 08/20/2019   AST 17 08/20/2019   ALKPHOS 79 08/20/2019   BILITOT 0.3 08/20/2019   Lab Results  Component Value Date   HGBA1C 5.1 07/09/2019   Lab Results  Component Value Date   INSULIN 8.9 08/20/2019   Lab Results  Component Value Date   TSH 1.350 08/20/2019   Lab Results  Component Value Date   CHOL 235 (H) 08/20/2019   HDL 48 08/20/2019   LDLCALC 149 (H) 08/20/2019   TRIG 207 (H) 08/20/2019   Lab Results  Component Value Date   WBC 6.2 08/20/2019   HGB 15.7 08/20/2019   HCT 46.3 08/20/2019   MCV 90 08/20/2019   PLT 191 08/20/2019   No results found for: IRON, TIBC, FERRITIN  Attestation Statements:   Reviewed by clinician on day of visit: allergies, medications, problem list, medical history, surgical history, family history, social history, and previous encounter notes.   I, Trixie Dredge, am acting as  transcriptionist for April Manson, MD.  I have reviewed the above documentation for accuracy and completeness, and I agree with the above. - Ilene Qua, MD

## 2019-09-20 ENCOUNTER — Ambulatory Visit (INDEPENDENT_AMBULATORY_CARE_PROVIDER_SITE_OTHER): Payer: 59 | Admitting: Family Medicine

## 2019-09-22 ENCOUNTER — Other Ambulatory Visit (INDEPENDENT_AMBULATORY_CARE_PROVIDER_SITE_OTHER): Payer: Self-pay | Admitting: Family Medicine

## 2019-09-22 DIAGNOSIS — E559 Vitamin D deficiency, unspecified: Secondary | ICD-10-CM

## 2021-12-30 ENCOUNTER — Encounter (INDEPENDENT_AMBULATORY_CARE_PROVIDER_SITE_OTHER): Payer: Self-pay

## 2022-05-19 ENCOUNTER — Telehealth: Payer: Self-pay

## 2022-05-19 NOTE — Telephone Encounter (Signed)
Transition Care Management Unsuccessful Follow-up Telephone Call  Date of discharge and from where:  05/13/2022-[facility]   Attempts:  1st Attempt  Reason for unsuccessful TCM follow-up call:  Left voice message
